# Patient Record
Sex: Female | Born: 1960 | Race: White | Hispanic: No | Marital: Single
Health system: Southern US, Community
[De-identification: ages and names within clinical notes are randomized; demographics above are authoritative.]

---

## 2020-10-24 ENCOUNTER — Observation Stay (HOSPITAL_COMMUNITY): Payer: Self-pay

## 2020-10-24 ENCOUNTER — Emergency Department (HOSPITAL_COMMUNITY): Payer: Self-pay

## 2020-10-24 ENCOUNTER — Encounter (HOSPITAL_COMMUNITY): Payer: Self-pay | Admitting: Neurology

## 2020-10-24 ENCOUNTER — Observation Stay (HOSPITAL_COMMUNITY)
Admission: EM | Admit: 2020-10-24 | Discharge: 2020-10-25 | Disposition: A | Discharge status: Home / Self Care | Payer: Self-pay | Attending: Student in an Organized Health Care Education/Training Program | Admitting: Student in an Organized Health Care Education/Training Program

## 2020-10-24 DIAGNOSIS — Z20822 Contact with and (suspected) exposure to covid-19: Secondary | ICD-10-CM | POA: Insufficient documentation

## 2020-10-24 DIAGNOSIS — R479 Unspecified speech disturbances: Principal | ICD-10-CM | POA: Insufficient documentation

## 2020-10-24 DIAGNOSIS — Z79899 Other long term (current) drug therapy: Secondary | ICD-10-CM | POA: Insufficient documentation

## 2020-10-24 DIAGNOSIS — E669 Obesity, unspecified: Secondary | ICD-10-CM | POA: Diagnosis present

## 2020-10-24 DIAGNOSIS — G459 Transient cerebral ischemic attack, unspecified: Secondary | ICD-10-CM | POA: Diagnosis present

## 2020-10-24 DIAGNOSIS — I10 Essential (primary) hypertension: Secondary | ICD-10-CM | POA: Diagnosis present

## 2020-10-24 LAB — COMPREHENSIVE METABOLIC PANEL
ALT: 36 U/L (ref 0–44)
AST: 27 U/L (ref 15–41)
Albumin: 3.9 g/dL (ref 3.5–5.0)
Alkaline Phosphatase: 80 U/L (ref 38–126)
Anion gap: 11 (ref 5–15)
BUN: 15 mg/dL (ref 8–23)
CO2: 26 mmol/L (ref 22–32)
Calcium: 9.8 mg/dL (ref 8.9–10.3)
Chloride: 101 mmol/L (ref 98–111)
Creatinine, Ser: 0.71 mg/dL (ref 0.44–1.00)
GFR, Estimated: >60 mL/min (ref >60)
Glucose, Bld: 103 mg/dL — ABNORMAL HIGH (ref 70–99)
Potassium: 3.9 mmol/L (ref 3.5–5.1)
Sodium: 138 mmol/L (ref 135–145)
Total Bilirubin: 0.6 mg/dL (ref 0.3–1.2)
Total Protein: 6.8 g/dL (ref 6.5–8.1)

## 2020-10-24 LAB — RAPID URINE DRUG SCREEN, HOSP PERFORMED
Amphetamines: NOT DETECTED
Barbiturates: NOT DETECTED
Benzodiazepines: POSITIVE — AB
Cocaine: NOT DETECTED
Opiates: NOT DETECTED
Tetrahydrocannabinol: NOT DETECTED

## 2020-10-24 LAB — CBC
HCT: 44.8 % (ref 36.0–46.0)
Hemoglobin: 14.4 g/dL (ref 12.0–15.0)
MCH: 29.4 pg (ref 26.0–34.0)
MCHC: 32.1 g/dL (ref 30.0–36.0)
MCV: 91.4 fL (ref 80.0–100.0)
Platelets: 260 10*3/uL (ref 150–400)
RBC: 4.9 MIL/uL (ref 3.87–5.11)
RDW: 12.6 % (ref 11.5–15.5)
WBC: 8.9 10*3/uL (ref 4.0–10.5)
nRBC: 0 % (ref 0.0–0.2)

## 2020-10-24 LAB — DIFFERENTIAL
Abs Immature Granulocytes: 0.03 10*3/uL (ref 0.00–0.07)
Basophils Absolute: 0.1 10*3/uL (ref 0.0–0.1)
Basophils Relative: 1 %
Eosinophils Absolute: 0.4 10*3/uL (ref 0.0–0.5)
Eosinophils Relative: 4 %
Immature Granulocytes: 0 %
Lymphocytes Relative: 16 %
Lymphs Abs: 1.5 10*3/uL (ref 0.7–4.0)
Monocytes Absolute: 0.8 10*3/uL (ref 0.1–1.0)
Monocytes Relative: 8 %
Neutro Abs: 6.3 10*3/uL (ref 1.7–7.7)
Neutrophils Relative %: 71 %

## 2020-10-24 LAB — URINALYSIS, ROUTINE W REFLEX MICROSCOPIC
Bilirubin Urine: NEGATIVE
Glucose, UA: NEGATIVE mg/dL
Hgb urine dipstick: NEGATIVE
Ketones, ur: NEGATIVE mg/dL
Leukocytes,Ua: NEGATIVE
Nitrite: NEGATIVE
Protein, ur: NEGATIVE mg/dL
Specific Gravity, Urine: 1.011 (ref 1.005–1.030)
pH: 6 (ref 5.0–8.0)

## 2020-10-24 LAB — PROTIME-INR
INR: 1 (ref 0.8–1.2)
Prothrombin Time: 13 seconds (ref 11.4–15.2)

## 2020-10-24 LAB — CBG MONITORING, ED: Glucose-Capillary: 97 mg/dL (ref 70–99)

## 2020-10-24 LAB — I-STAT CHEM 8, ED
BUN: 17 mg/dL (ref 8–23)
Calcium, Ion: 1.2 mmol/L (ref 1.15–1.40)
Chloride: 103 mmol/L (ref 98–111)
Creatinine, Ser: 0.7 mg/dL (ref 0.44–1.00)
Glucose, Bld: 99 mg/dL (ref 70–99)
HCT: 42 % (ref 36.0–46.0)
Hemoglobin: 14.3 g/dL (ref 12.0–15.0)
Potassium: 4 mmol/L (ref 3.5–5.1)
Sodium: 139 mmol/L (ref 135–145)
TCO2: 28 mmol/L (ref 22–32)

## 2020-10-24 LAB — APTT: aPTT: 26 seconds (ref 24–36)

## 2020-10-24 LAB — RESP PANEL BY RT PCR (RSV, FLU A&B, COVID)
Influenza A by PCR: NEGATIVE
Influenza B by PCR: NEGATIVE
Respiratory Syncytial Virus by PCR: NEGATIVE
SARS Coronavirus 2 by RT PCR: NEGATIVE

## 2020-10-24 LAB — ETHANOL: Alcohol, Ethyl (B): <10 mg/dL (ref <10)

## 2020-10-24 MED ORDER — GADOBUTROL 1 MMOL/ML IV SOLN
10.0000 mL | Freq: Once | INTRAVENOUS | Status: AC | PRN
  Administered 2020-10-24: 10 mL via INTRAVENOUS

## 2020-10-24 MED ORDER — ASPIRIN 325 MG PO TABS: 325.0000 mg | ORAL_TABLET | Freq: Once | ORAL | Status: DC

## 2020-10-24 MED ORDER — ACETAMINOPHEN 160 MG/5ML PO SOLN: 650.0000 mg | ORAL | Status: DC | PRN

## 2020-10-24 MED ORDER — STROKE: EARLY STAGES OF RECOVERY BOOK
Freq: Once | Status: AC
  Filled 2020-10-24: qty 1

## 2020-10-24 MED ORDER — ACETAMINOPHEN 325 MG PO TABS: 650.0000 mg | ORAL_TABLET | ORAL | Status: DC | PRN

## 2020-10-24 MED ORDER — SENNOSIDES-DOCUSATE SODIUM 8.6-50 MG PO TABS: 1.0000 | ORAL_TABLET | Freq: Every evening | ORAL | Status: DC | PRN

## 2020-10-24 MED ORDER — ATORVASTATIN CALCIUM 80 MG PO TABS
80.0000 mg | ORAL_TABLET | Freq: Every day | ORAL | Status: DC
  Administered 2020-10-25: 80 mg via ORAL
  Filled 2020-10-24: qty 1

## 2020-10-24 MED ORDER — ACETAMINOPHEN 650 MG RE SUPP: 650.0000 mg | RECTAL | Status: DC | PRN

## 2020-10-24 MED ORDER — ASPIRIN 81 MG PO CHEW
81.0000 mg | CHEWABLE_TABLET | Freq: Every day | ORAL | Status: DC
  Administered 2020-10-25: 81 mg via ORAL
  Filled 2020-10-24: qty 1
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
## 2020-10-24 NOTE — Procedures (Signed)
Patient Name: Zoe Bryan  MRN: [id number]  Epilepsy Attending: Charlsie Quest  Referring Physician/Provider: Felicie Morn, PA Date: 10/24/2020 Duration: 24.58 mins  Patient history: 59 yo F with transient speech disturbance. EEG to evaluate for seizure  Level of alertness: Awake  AEDs during EEG study: Versed  Technical aspects: This EEG study was done with scalp electrodes positioned according to the 10-20 International system of electrode placement. Electrical activity was acquired at a sampling rate of 500Hz  and reviewed with a high frequency filter of 70Hz  and a low frequency filter of 1Hz . EEG data were recorded continuously and digitally stored.   Description: The posterior dominant rhythm consists of 9-10 Hz activity of moderate voltage (25-35 uV) seen predominantly in posterior head regions, symmetric and reactive to eye opening and eye closing. Physiologic photic driving was seen during photic stimulation.  Hyperventilation was not performed.     IMPRESSION: This study is within normal limits. No seizures or epileptiform discharges were seen throughout the recording.  Cortlan Dolin 

## 2020-10-24 NOTE — Code Documentation (Signed)
Stroke Response Nurse Documentation Code Documentation  Zoe Bryan is a 59 y.o. female arriving to [facility] ED via [facility] on 10/24/2020 with unkown PMH. Code stroke was activated by EMS. Patient from home where she was with her boyfriend, patient left room to make food and when she came back was "not behaving like herself". Per boyfriend patient walked to a nearby window and began counting down backwards from 10. On No antithrombotic. Stroke team at the bedside on patient arrival. Labs drawn and patient cleared for CT by Dr. Rush Landmark. Patient to CT with team. NIHSS 0, symptoms resolved en route to hospital, see documentation for details and code stroke times. The following imaging was completed:  CT head, MRI. Patient is not a candidate for tPA due to resolution of symptoms. Care/Plan: q2h vs/neuro checks for next 12h, see orders. Bedside handoff with ED RN Annice Pih.    Zoe Bryan  Stroke Response RN

## 2020-10-24 NOTE — ED Notes (Signed)
BP cuff replaced on patients arm.

## 2020-10-24 NOTE — ED Provider Notes (Signed)
[facility] EMERGENCY DEPARTMENT Provider Note   CSN: [id number] Arrival date & time: 10/24/20  1343     History No chief complaint on file.   Zoe Bryan is a 59 y.o. female.  The history is provided by the patient and medical records. No language interpreter was used.  Neurologic Problem This is a new problem. The current episode started 1 to 2 hours ago. The problem occurs constantly. The problem has been resolved. Pertinent negatives include no chest pain, no abdominal pain, no headaches and no shortness of breath. Nothing aggravates the symptoms. Nothing relieves the symptoms. She has tried nothing for the symptoms. The treatment provided no relief.       No past medical history on file.  There are no problems to display for this patient.     OB History   No obstetric history on file.     No family history on file.  Social History   Tobacco Use  . Smoking status: Not on file  Substance Use Topics  . Alcohol use: Not on file  . Drug use: Not on file    Home Medications Prior to Admission medications   Not on File    Allergies    Patient has no allergy information on record.  Review of Systems   Review of Systems  Constitutional: Negative for chills, diaphoresis, fatigue and fever.  HENT: Negative for congestion.   Eyes: Negative for photophobia and visual disturbance.  Respiratory: Negative for cough, chest tightness, shortness of breath and wheezing.   Cardiovascular: Negative for chest pain.  Gastrointestinal: Negative for abdominal pain, constipation, diarrhea, nausea and vomiting.  Genitourinary: Negative for dysuria, flank pain and frequency.  Musculoskeletal: Negative for back pain, neck pain and neck stiffness.  Skin: Negative for rash and wound.  Neurological: Positive for speech difficulty. Negative for dizziness, syncope, weakness, light-headedness, numbness and headaches.  Psychiatric/Behavioral: Negative for  agitation, behavioral problems and confusion.  All other systems reviewed and are negative.   Physical Exam Updated Vital Signs BP 132/78   Pulse 93   Temp 97.9 F (36.6 C) (Oral)   Resp 19   SpO2 95%   Physical Exam Vitals and nursing note reviewed.  Constitutional:      General: She is not in acute distress.    Appearance: She is well-developed. She is not ill-appearing, toxic-appearing or diaphoretic.  HENT:     Head: Normocephalic and atraumatic.     Nose: No congestion or rhinorrhea.     Mouth/Throat:     Mouth: Mucous membranes are moist.     Pharynx: No oropharyngeal exudate or posterior oropharyngeal erythema.  Eyes:     Extraocular Movements: Extraocular movements intact.     Conjunctiva/sclera: Conjunctivae normal.     Pupils: Pupils are equal, round, and reactive to light.  Cardiovascular:     Rate and Rhythm: Normal rate and regular rhythm.     Pulses: Normal pulses.     Heart sounds: No murmur heard.   Pulmonary:     Effort: Pulmonary effort is normal. No respiratory distress.     Breath sounds: Normal breath sounds. No wheezing, rhonchi or rales.  Chest:     Chest wall: No tenderness.  Abdominal:     General: Abdomen is flat.     Palpations: Abdomen is soft.     Tenderness: There is no abdominal tenderness. There is no right CVA tenderness, left CVA tenderness, guarding or rebound.  Musculoskeletal:  General: No tenderness, deformity or signs of injury.     Cervical back: Neck supple. No tenderness.     Right lower leg: No edema.     Left lower leg: No edema.  Skin:    General: Skin is warm and dry.     Capillary Refill: Capillary refill takes less than 2 seconds.     Findings: No erythema.  Neurological:     General: No focal deficit present.     Mental Status: She is alert and oriented to person, place, and time.     Sensory: No sensory deficit.     Motor: No weakness.     Coordination: Coordination normal.  Psychiatric:        Mood and  Affect: Mood normal.     ED Results / Procedures / Treatments   Labs (all labs ordered are listed, but only abnormal results are displayed) Labs Reviewed  COMPREHENSIVE METABOLIC PANEL - Abnormal; Notable for the following components:      Result Value   Glucose, Bld 103 (*)    All other components within normal limits  ETHANOL  PROTIME-INR  APTT  CBC  DIFFERENTIAL  RAPID URINE DRUG SCREEN, HOSP PERFORMED  URINALYSIS, ROUTINE W REFLEX MICROSCOPIC  I-STAT CHEM 8, ED  CBG MONITORING, ED    EKG EKG Interpretation  Date/Time:  Thursday October 24 2020 15:11:31 EDT Ventricular Rate:  90 PR Interval:    QRS Duration: 101 QT Interval:  348 QTC Calculation: 426 R Axis:   63 Text Interpretation: Sinus rhythm Low voltage, precordial leads no prior ECG for comparison. No STEMI Confirmed by Theda Belfast ([id number]) on 10/24/2020 3:50:49 PM   Radiology MR ANGIO HEAD WO CONTRAST  Result Date: 10/24/2020 CLINICAL DATA:  Seizure, abnormal neuro exam; Neuro deficit, acute, stroke suspected EXAM: MRI HEAD WITHOUT AND WITH CONTRAST MRA HEAD WITHOUT CONTRAST MRA NECK WITHOUT AND WITH CONTRAST TECHNIQUE: Multiplanar, multiecho pulse sequences of the brain and surrounding structures were obtained without and with intravenous contrast. Angiographic images of the Circle of Willis were obtained using MRA technique without intravenous contrast. Angiographic images of the neck were obtained using MRA technique without and with intravenous contrast. Carotid stenosis measurements (when applicable) are obtained utilizing NASCET criteria, using the distal internal carotid diameter as the denominator. CONTRAST:  32mL GADAVIST GADOBUTROL 1 MMOL/ML IV SOLN COMPARISON:  10/24/2020 head CT. FINDINGS: MRI HEAD FINDINGS Brain: No diffusion-weighted signal abnormality. No intracranial hemorrhage. No midline shift, ventriculomegaly or extra-axial fluid collection. No mass lesion. No abnormal enhancement. Cerebral  volume is within normal limits. Minimal chronic microvascular ischemic changes. Vascular: Small right frontal developmental venous anomaly. Please see MRA for additional details. Skull and upper cervical spine: Normal marrow signal. Sinuses/Orbits: Normal orbits. Left sphenoid sinus mucous retention cyst. No mastoid effusion. Other: None. MRA HEAD FINDINGS Anterior circulation: Patent ICAs, anterior and middle cerebral arteries. No significant stenosis, proximal occlusion, aneurysm, or vascular malformation. Bilateral PCOM hypoplasia. Posterior circulation: Patent PICA. The V4 segments, basilar, superior cerebellar and posterior cerebral arteries are patent. Mild narrowing of the left superior cerebellar artery. No high-grade stenosis, large vessel occlusion, aneurysm, or vascular malformation. Venous sinuses: No evidence of thrombosis. Anatomic variants: Please see above. MRA NECK FINDINGS There is no high-grade narrowing or focal aneurysm involving the bilateral carotid arteries. No evidence of dissection. The bilateral vertebral arteries are patent and demonstrate antegrade flow. Codominant vertebral arteries. No evidence of high-grade narrowing or focal aneurysm. IMPRESSION: MRI head: No acute intracranial process. Minimal chronic microvascular  ischemic changes. MRA head and neck: No large vessel occlusion, high-grade narrowing, dissection or aneurysm. Mild left superior cerebellar artery narrowing. Electronically Signed   By: Stana Bunting M.D.   On: 10/24/2020 15:38   MR Angiogram Neck W or Wo Contrast  Result Date: 10/24/2020 CLINICAL DATA:  Seizure, abnormal neuro exam; Neuro deficit, acute, stroke suspected EXAM: MRI HEAD WITHOUT AND WITH CONTRAST MRA HEAD WITHOUT CONTRAST MRA NECK WITHOUT AND WITH CONTRAST TECHNIQUE: Multiplanar, multiecho pulse sequences of the brain and surrounding structures were obtained without and with intravenous contrast. Angiographic images of the Circle of Willis were  obtained using MRA technique without intravenous contrast. Angiographic images of the neck were obtained using MRA technique without and with intravenous contrast. Carotid stenosis measurements (when applicable) are obtained utilizing NASCET criteria, using the distal internal carotid diameter as the denominator. CONTRAST:  10mL GADAVIST GADOBUTROL 1 MMOL/ML IV SOLN COMPARISON:  10/24/2020 head CT. FINDINGS: MRI HEAD FINDINGS Brain: No diffusion-weighted signal abnormality. No intracranial hemorrhage. No midline shift, ventriculomegaly or extra-axial fluid collection. No mass lesion. No abnormal enhancement. Cerebral volume is within normal limits. Minimal chronic microvascular ischemic changes. Vascular: Small right frontal developmental venous anomaly. Please see MRA for additional details. Skull and upper cervical spine: Normal marrow signal. Sinuses/Orbits: Normal orbits. Left sphenoid sinus mucous retention cyst. No mastoid effusion. Other: None. MRA HEAD FINDINGS Anterior circulation: Patent ICAs, anterior and middle cerebral arteries. No significant stenosis, proximal occlusion, aneurysm, or vascular malformation. Bilateral PCOM hypoplasia. Posterior circulation: Patent PICA. The V4 segments, basilar, superior cerebellar and posterior cerebral arteries are patent. Mild narrowing of the left superior cerebellar artery. No high-grade stenosis, large vessel occlusion, aneurysm, or vascular malformation. Venous sinuses: No evidence of thrombosis. Anatomic variants: Please see above. MRA NECK FINDINGS There is no high-grade narrowing or focal aneurysm involving the bilateral carotid arteries. No evidence of dissection. The bilateral vertebral arteries are patent and demonstrate antegrade flow. Codominant vertebral arteries. No evidence of high-grade narrowing or focal aneurysm. IMPRESSION: MRI head: No acute intracranial process. Minimal chronic microvascular ischemic changes. MRA head and neck: No large vessel  occlusion, high-grade narrowing, dissection or aneurysm. Mild left superior cerebellar artery narrowing. Electronically Signed   By: Stana Bunting M.D.   On: 10/24/2020 15:38   MR Brain W and Wo Contrast  Result Date: 10/24/2020 CLINICAL DATA:  Seizure, abnormal neuro exam; Neuro deficit, acute, stroke suspected EXAM: MRI HEAD WITHOUT AND WITH CONTRAST MRA HEAD WITHOUT CONTRAST MRA NECK WITHOUT AND WITH CONTRAST TECHNIQUE: Multiplanar, multiecho pulse sequences of the brain and surrounding structures were obtained without and with intravenous contrast. Angiographic images of the Circle of Willis were obtained using MRA technique without intravenous contrast. Angiographic images of the neck were obtained using MRA technique without and with intravenous contrast. Carotid stenosis measurements (when applicable) are obtained utilizing NASCET criteria, using the distal internal carotid diameter as the denominator. CONTRAST:  10mL GADAVIST GADOBUTROL 1 MMOL/ML IV SOLN COMPARISON:  10/24/2020 head CT. FINDINGS: MRI HEAD FINDINGS Brain: No diffusion-weighted signal abnormality. No intracranial hemorrhage. No midline shift, ventriculomegaly or extra-axial fluid collection. No mass lesion. No abnormal enhancement. Cerebral volume is within normal limits. Minimal chronic microvascular ischemic changes. Vascular: Small right frontal developmental venous anomaly. Please see MRA for additional details. Skull and upper cervical spine: Normal marrow signal. Sinuses/Orbits: Normal orbits. Left sphenoid sinus mucous retention cyst. No mastoid effusion. Other: None. MRA HEAD FINDINGS Anterior circulation: Patent ICAs, anterior and middle cerebral arteries. No significant stenosis, proximal occlusion, aneurysm, or  vascular malformation. Bilateral PCOM hypoplasia. Posterior circulation: Patent PICA. The V4 segments, basilar, superior cerebellar and posterior cerebral arteries are patent. Mild narrowing of the left superior  cerebellar artery. No high-grade stenosis, large vessel occlusion, aneurysm, or vascular malformation. Venous sinuses: No evidence of thrombosis. Anatomic variants: Please see above. MRA NECK FINDINGS There is no high-grade narrowing or focal aneurysm involving the bilateral carotid arteries. No evidence of dissection. The bilateral vertebral arteries are patent and demonstrate antegrade flow. Codominant vertebral arteries. No evidence of high-grade narrowing or focal aneurysm. IMPRESSION: MRI head: No acute intracranial process. Minimal chronic microvascular ischemic changes. MRA head and neck: No large vessel occlusion, high-grade narrowing, dissection or aneurysm. Mild left superior cerebellar artery narrowing. Electronically Signed   By: Stana Buntinghikanele  Emekauwa M.D.   On: 10/24/2020 15:38   CT HEAD CODE STROKE WO CONTRAST  Result Date: 10/24/2020 CLINICAL DATA:  Code stroke. Neuro deficit, acute, stroke suspected. Additional provided: Altered mental status, speech difficulty. EXAM: CT HEAD WITHOUT CONTRAST TECHNIQUE: Contiguous axial images were obtained from the base of the skull through the vertex without intravenous contrast. COMPARISON:  No pertinent prior exams are available for comparison. FINDINGS: Brain: Cerebral volume is normal for age. There is no acute intracranial hemorrhage. No demarcated cortical infarct. No extra-axial fluid collection. No evidence of intracranial mass. No midline shift. Partially empty sella turcica. Vascular: No hyperdense vessel. Skull: Normal. Negative for fracture or focal lesion. Sinuses/Orbits: Visualized orbits show no acute finding. Small left sphenoid sinus mucous retention cyst. ASPECTS ([place]berta Stroke Program Early CT Score) - Ganglionic level infarction (caudate, lentiform nuclei, internal capsule, insula, M1-M3 cortex): 7 - Supraganglionic infarction (M4-M6 cortex): 3 Total score (0-10 with 10 being normal): 10 These results were called by telephone at the time of  interpretation on 10/24/2020 at 2:05 pm to provider Dr. Iver NestleBhagat, who verbally acknowledged these results. IMPRESSION: No evidence of acute intracranial abnormality.  ASPECTS is 10. Electronically Signed   By: Jackey LogeKyle  Golden DO   On: 10/24/2020 14:06    Procedures Procedures (including critical care time)  Medications Ordered in ED Medications  gadobutrol (GADAVIST) 1 MMOL/ML injection 10 mL (10 mLs Intravenous Contrast Given 10/24/20 1510)    ED Course  I have reviewed the triage vital signs and the nursing notes.  Pertinent labs & imaging results that were available during my care of the patient were reviewed by me and considered in my medical decision making (see chart for details).    MDM Rules/Calculators/A&P                          Zoe Gilfordatricia Grosser is a 59 y.o. female with no significant past medical history who presents as a code stroke for difficulty with speech and object identification.  Patient was last normal at 12:20 PM.  According to EMS, patient was found by family staring off into space and talking to herself.  She not answering questions appropriate.  She cannot identify objects with EMS.  Patient then got agitated and had to get Versed for agitation.  She was not having any seizure-like activity they could see.  Patient was back to baseline by the time she arrived to the emergency department and denies any complaints currently.  On initial exam at the bridge, airway was intact.  She was quickly taken to CT scanner.  On further exam, she had normal strength, sensation in extremities.  Facial exam showed no droop.  Clear speech.  Normal object identification  on my exam.  Pupils are symmetric reactive normal extraocular movements.  Lungs were clear and chest was nontender.  Abdomen was nontender.  Patient resting comfortably.  CT imaging did not show acute stroke.  Neurology recommended MRI.  They feel that even if MRI is normal, they are concerned she could have had either a TIA  versus a seizure.  They request admission for further work-up and management.  Medicine team called for admission and they will see and admit patient.  Final Clinical Impression(s) / ED Diagnoses Final diagnoses:  Transient speech disturbance     Clinical Impression: 1. Transient speech disturbance     Disposition: Admit  This note was prepared with assistance of Dragon voice recognition software. Occasional wrong-word or sound-a-like substitutions may have occurred due to the inherent limitations of voice recognition software.      Nardos Putnam, Canary Brim, MD 10/24/20 1555

## 2020-10-24 NOTE — ED Notes (Signed)
Patient ambulated to the bathroom with no difficulty.
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
## 2020-10-24 NOTE — Progress Notes (Signed)
EEG complete - results pending 

## 2020-10-24 NOTE — ED Triage Notes (Signed)
Pt arrived by EMS from home. Code Stroke Called.  Per medics at 1220 pt significant other noticed pt was staring into space and talking to herself.  Medics report pt not appropriately answering questions and becoming combative when  Trying to assess her. Medics gave 2mg  Versed for agitation   On arrival pt alert and oriented, no slurred speech. Answering questions approprietly

## 2020-10-25 ENCOUNTER — Other Ambulatory Visit: Payer: Self-pay

## 2020-10-25 ENCOUNTER — Observation Stay (HOSPITAL_BASED_OUTPATIENT_CLINIC_OR_DEPARTMENT_OTHER): Payer: Self-pay

## 2020-10-25 DIAGNOSIS — E785 Hyperlipidemia, unspecified: Secondary | ICD-10-CM

## 2020-10-25 DIAGNOSIS — I10 Essential (primary) hypertension: Secondary | ICD-10-CM

## 2020-10-25 DIAGNOSIS — G459 Transient cerebral ischemic attack, unspecified: Secondary | ICD-10-CM

## 2020-10-25 DIAGNOSIS — E669 Obesity, unspecified: Secondary | ICD-10-CM | POA: Diagnosis present

## 2020-10-25 LAB — ECHOCARDIOGRAM COMPLETE
Area-P 1/2: 3.91 cm2
S' Lateral: 2.5 cm

## 2020-10-25 LAB — HEMOGLOBIN A1C
Hgb A1c MFr Bld: 5.1 % (ref 4.8–5.6)
Mean Plasma Glucose: 99.67 mg/dL

## 2020-10-25 LAB — LIPID PANEL
Cholesterol: 148 mg/dL (ref 0–200)
HDL: 39 mg/dL — ABNORMAL LOW (ref >40)
LDL Cholesterol: 89 mg/dL (ref 0–99)
Total CHOL/HDL Ratio: 3.8 RATIO
Triglycerides: 102 mg/dL (ref <150)
VLDL: 20 mg/dL (ref 0–40)

## 2020-10-25 LAB — HIV ANTIBODY (ROUTINE TESTING W REFLEX): HIV Screen 4th Generation wRfx: NONREACTIVE

## 2020-10-25 MED ORDER — LISINOPRIL 20 MG PO TABS
20.0000 mg | ORAL_TABLET | Freq: Every day | ORAL | Status: DC
  Administered 2020-10-25: 20 mg via ORAL
  Filled 2020-10-25: qty 1

## 2020-10-25 MED ORDER — LISINOPRIL-HYDROCHLOROTHIAZIDE 20-25 MG PO TABS: 1.0000 | ORAL_TABLET | Freq: Every day | ORAL | Status: DC

## 2020-10-25 MED ORDER — ASPIRIN EC 81 MG PO TBEC: 81.0000 mg | DELAYED_RELEASE_TABLET | Freq: Every day | ORAL | 0 refills | Status: AC

## 2020-10-25 MED ORDER — ATORVASTATIN CALCIUM 80 MG PO TABS: 80.0000 mg | ORAL_TABLET | Freq: Every day | ORAL | 0 refills | Status: AC

## 2020-10-25 MED ORDER — HYDROCHLOROTHIAZIDE 25 MG PO TABS
25.0000 mg | ORAL_TABLET | Freq: Every day | ORAL | Status: DC
  Filled 2020-10-25: qty 1
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
## 2020-10-25 NOTE — Progress Notes (Signed)
Spoke with Zoe Bryan at bedside about her echo result. Discussed importance of f/u with outpatient PCP and neurology. Zoe Bryan expressed understanding.

## 2020-10-25 NOTE — Plan of Care (Signed)
  Problem: Education: Goal: Knowledge of disease or condition will improve Outcome: Progressing   

## 2020-10-25 NOTE — Progress Notes (Signed)
  Echocardiogram 2D Echocardiogram has been performed.  Tye Savoy 10/25/2020, 1:26 PM
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
## 2020-10-25 NOTE — Discharge Instructions (Signed)
Transient Ischemic Attack  A transient ischemic attack (TIA) is a "warning stroke" that causes stroke-like symptoms that go away quickly. A TIA does not cause lasting damage to the brain. But having a TIA is a sign that you may be at risk for a stroke. Lifestyle changes and medical treatments can help prevent a stroke. It is important to know the symptoms of a TIA and what to do. Get help right away, even if your symptoms go away. The symptoms of a TIA are the same as those of a stroke. They can happen fast, and they usually go away within minutes or hours. They can include:  Weakness or loss of feeling in your face, arm, or leg. This often happens on one side of your body.  Trouble walking.  Trouble moving your arms or legs.  Trouble talking or understanding what people are saying.  Trouble seeing.  Seeing two of one object (double vision).  Feeling dizzy.  Feeling confused.  Loss of balance or coordination.  Feeling sick to your stomach (nauseous) and throwing up (vomiting).  A very bad headache for no reason. What increases the risk? Certain things may make you more likely to have a TIA. Some of these are things that you can change, such as:  Being very overweight (obese).  Using products that contain nicotine or tobacco, such as cigarettes and e-cigarettes.  Taking birth control pills.  Not being active.  Drinking too much alcohol.  Using drugs. Other risk factors include:  Having an irregular heartbeat (atrial fibrillation).  Being African American or Hispanic.  Having had blood clots, stroke, TIA, or heart attack in the past.  Being a woman with a history of high blood pressure in pregnancy (preeclampsia).  Being over the age of 60.  Being female.  Having family history of stroke.  Having the following diseases or conditions: ? High blood pressure. ? High cholesterol. ? Diabetes. ? Heart disease. ? Sickle cell disease. ? Sleep apnea. ? Migraine  headache. ? Long-term (chronic) diseases that cause soreness and swelling (inflammation). ? Disorders that affect how your blood clots. Follow these instructions at home: Medicines   Take over-the-counter and prescription medicines only as told by your doctor.  If you were told to take aspirin or another medicine to thin your blood, take it exactly as told by your doctor. ? Taking too much of the medicine can cause bleeding. ? Taking too little of the medicine may not work to treat the problem. Eating and drinking   Eat 5 or more servings of fruits and vegetables each day.  Follow instructions from your doctor about your diet. You may need to follow a certain diet to help lower your risk of having a stroke. You may need to: ? Eat a diet that is low in fat and salt. ? Eat foods that contain a lot of fiber. ? Limit the amount of carbohydrates and sugar in your diet.  Limit alcohol intake to 1 drink a day for nonpregnant women and 2 drinks a day for men. One drink equals 12 oz of beer, 5 oz of wine, or 1 oz of hard liquor. General instructions  Keep a healthy weight.  Stay active. Try to get at least 30 minutes of activity on all or most days.  Find out if you have a condition called sleep apnea. Get treatment if needed.  Do not use any products that contain nicotine or tobacco, such as cigarettes and e-cigarettes. If you need help quitting,   ask your doctor.  Do not abuse drugs.  Keep all follow-up visits as told by your doctor. This is important. Get help right away if:  You have any signs of stroke. "BE FAST" is an easy way to remember the main warning signs: ? B - Balance. Signs are dizziness, sudden trouble walking, or loss of balance. ? E - Eyes. Signs are trouble seeing or a sudden change in how you see. ? F - Face. Signs are sudden weakness or loss of feeling of the face, or the face or eyelid drooping on one side. ? A - Arms. Signs are weakness or loss of feeling in an  arm. This happens suddenly and usually on one side of the body. ? S - Speech. Signs are sudden trouble speaking, slurred speech, or trouble understanding what people say. ? T - Time. Time to call emergency services. Write down what time symptoms started.  You have other signs of stroke, such as: ? A sudden, very bad headache with no known cause. ? Feeling sick to your stomach (nausea). ? Throwing up (vomiting). ? Jerky movements that you cannot control (seizure). These symptoms may be an emergency. Do not wait to see if the symptoms will go away. Get medical help right away. Call your local emergency services (911 in the [place] Do not drive yourself to the hospital. Summary  A transient ischemic attack (TIA) is a "warning stroke" that causes stroke-like symptoms that go away quickly.  A TIA is a medical emergency. Get help right away, even if your symptoms go away.  A TIA does not cause lasting damage to the brain.  Having a TIA is a sign that you may be at risk for a stroke. Lifestyle changes and medical treatments can help prevent a stroke. This information is not intended to replace advice given to you by your health care provider. Make sure you discuss any questions you have with your health care provider. Document Revised: 09/02/2018 Document Reviewed: 03/10/2017 Elsevier Patient Education  2020 [employer]  

## 2020-10-25 NOTE — Progress Notes (Signed)
Occupational Therapy screen. Patient Details Name: Zoe Bryan MRN: [id number] DOB: 1961/10/04 Today's Date: 10/25/2020    History of present illness 59yo female presenting with expressive aphasia and "brain fog". Received Versed from EMT and symptoms resolved in less than 1 hour. MRI/MRA/CTH negative, EEG negative for seizure activity. No tpa given. Admitted for w/u of TIA vs seizure activity. PMH HTN, OA, seizure   OT comments  OT screen performed. Pt. Does not have any focal deficits. Pt. Does not have any mobility impairments. OT screen only.   Follow Up Recommendations       Equipment Recommendations       Recommendations for Other Services      Precautions / Restrictions Precautions Precautions: None Restrictions Weight Bearing Restrictions: No       Mobility Bed Mobility Overal bed mobility: Modified Independent             General bed mobility comments: HOB elevated for supine <-> sit, use of bed rails  Transfers Overall transfer level: Independent Equipment used: None             General transfer comment: distant S for safety, no physical assist given    Balance Overall balance assessment: Independent                                         ADL either performed or assessed with clinical judgement   ADL                                               Vision       Perception     Praxis      Cognition Arousal/Alertness: Awake/alert Behavior During Therapy: WFL for tasks assessed/performed Overall Cognitive Status: Within Functional Limits for tasks assessed                                          Exercises     Shoulder Instructions       General Comments HR to 123BPM with gait in hallway    Pertinent Vitals/ Pain       Pain Assessment: 0-10 Pain Score: 3  Pain Location: knees with mobility Pain Descriptors / Indicators: Tightness;Sore Pain Intervention(s):  Repositioned;Limited activity within patient's tolerance;Monitored during session  Home Living Family/patient expects to be discharged to:: Private residence Living Arrangements: Children Available Help at Discharge: Family;Available PRN/intermittently Type of Home: House Home Access: Ramped entrance     Home Layout: Two level Alternate Level Stairs-Number of Steps: 12 with R ascending rail Alternate Level Stairs-Rails: Right Bathroom Shower/Tub: Chief Strategy Officer: Standard     Home Equipment: None          Prior Functioning/Environment Level of Independence: Independent            Frequency           Progress Toward Goals  OT Goals(current goals can now be found in the care plan section)     Acute Rehab OT Goals Patient Stated Goal: go home today  Plan      Co-evaluation  AM-PAC OT "6 Clicks" Daily Activity     Outcome Measure                    End of Session        Activity Tolerance     Patient Left     Nurse Communication          Time:  -     Charges:    Zoe Bryan OT/L    Esraa Seres 10/25/2020, 10:45 AM
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
## 2020-10-25 NOTE — Evaluation (Signed)
Physical Therapy Evaluation Patient Details Name: Zoe Bryan MRN: [id number] DOB: Apr 20, 1961 Today's Date: 10/25/2020   History of Present Illness  59yo female presenting with expressive aphasia and "brain fog". Received Versed from EMT and symptoms resolved in less than 1 hour. MRI/MRA/CTH negative, EEG negative for seizure activity. No tpa given. Admitted for w/u of TIA vs seizure activity. PMH HTN, OA, seizure  Clinical Impression   Patient received sitting at EOB, surprised to see PT and looking forward to going home. Able to mobilize on a Mod(I) to independent basis with no device; does have mild gait impairment likely related to chronic knee pain, but she reports this is her baseline and has no concerns about mobility. HR to 123BPM with activity, returned to baseline with seated rest. Noted edema and mild inflammation B patellar tendons as well as hypomobile patellas and educated on cross-friction massage and self-patellar mobilizations to assist in reducing knee pain. Left sitting at EOB with all needs met. She is at her functional baseline with no need for skilled PT services- signing off, thank you for the opportunity to participate in her care!     Follow Up Recommendations No PT follow up    Equipment Recommendations  None recommended by PT    Recommendations for Other Services       Precautions / Restrictions Precautions Precautions: None Restrictions Weight Bearing Restrictions: No      Mobility  Bed Mobility Overal bed mobility: Modified Independent             General bed mobility comments: HOB elevated for supine <-> sit, use of bed rails    Transfers Overall transfer level: Independent Equipment used: None             General transfer comment: distant S for safety, no physical assist given  Ambulation/Gait Ambulation/Gait assistance: Independent Gait Distance (Feet): 200 Feet Assistive device: None Gait Pattern/deviations: Wide base of  support;WFL(Within Functional Limits);Step-through pattern;Trendelenburg Gait velocity: slightly decreased   General Gait Details: slow but steady with no device, gait pattern midly antalgic and consistent with prior reports of knee pain; 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    Modified Rankin (Stroke Patients Only)       Balance Overall balance assessment: Independent                                           Pertinent Vitals/Pain Pain Assessment: 0-10 Pain Score: 3  Pain Location: knees with mobility Pain Descriptors / Indicators: Tightness;Sore Pain Intervention(s): Repositioned;Limited activity within patient's tolerance;Monitored during session    [place] expects to be discharged to:: Private residence Living Arrangements: Children Available Help at Discharge: Family;Available PRN/intermittently Type of Home: House Home Access: Ramped entrance     Home Layout: Two level Home Equipment: None      Prior Function Level of Independence: Independent               Hand Dominance        Extremity/Trunk Assessment   Upper Extremity Assessment Upper Extremity Assessment: Overall WFL for tasks assessed    Lower Extremity Assessment Lower Extremity Assessment: Overall WFL for tasks assessed    Cervical / Trunk Assessment Cervical / Trunk Assessment: Normal  Communication   Communication: No difficulties  Cognition Arousal/Alertness: Awake/alert Behavior During  Therapy: WFL for tasks assessed/performed Overall Cognitive Status: Within Functional Limits for tasks assessed                                        General Comments General comments (skin integrity, edema, etc.): HR to 123BPM with gait in hallway    Exercises     Assessment/Plan    PT Assessment Patent does not need any further PT services  PT Problem List Decreased strength;Obesity;Decreased  mobility       PT Treatment Interventions Gait training;Therapeutic activities;Therapeutic exercise;Patient/family education    PT Goals (Current goals can be found in the Care Plan section)  Acute Rehab PT Goals Patient Stated Goal: go home today PT Goal Formulation: With patient Time For Goal Achievement: 11/08/20 Potential to Achieve Goals: Good    Frequency Other (Comment) (Eval only)   Barriers to discharge        Co-evaluation               AM-PAC PT "6 Clicks" Mobility  Outcome Measure Help needed turning from your back to your side while in a flat bed without using bedrails?: None Help needed moving from lying on your back to sitting on the side of a flat bed without using bedrails?: None Help needed moving to and from a bed to a chair (including a wheelchair)?: None Help needed standing up from a chair using your arms (e.g., wheelchair or bedside chair)?: None Help needed to walk in hospital room?: None Help needed climbing 3-5 steps with a railing? : A Little 6 Click Score: 23    End of Session   Activity Tolerance: Patient tolerated treatment well Patient left: in bed;with call bell/phone within reach (sitting at EOB per her request) Nurse Communication: Mobility status PT Visit Diagnosis: Muscle weakness (generalized) (M62.81)    Time: 6010-9323 PT Time Calculation (min) (ACUTE ONLY): 15 min   Charges:   PT Evaluation $PT Eval Low Complexity: 1 Low          Windell Norfolk, DPT, PN1   Supplemental Physical Therapist [place]    Pager [phone] Acute Rehab Office [phone]

## 2020-10-25 NOTE — Progress Notes (Signed)
Nsg Discharge Note  Admit Date:  10/24/2020 Discharge date: 10/25/2020   Zoe Bryan to be D/C'd Home per MD order.  AVS completed.  Copy for chart, and copy for patient signed, and dated. Patient/caregiver able to verbalize understanding.  Discharge Medication: Allergies as of 10/25/2020   No Known Allergies     Medication List    TAKE these medications   aspirin EC 81 MG tablet Take 1 tablet (81 mg total) by mouth daily. Swallow whole.   atorvastatin 80 MG tablet Commonly known as: Lipitor Take 1 tablet (80 mg total) by mouth daily.   FLEX-A-MIN JOINT FLEX PO Take 1 tablet by mouth in the morning and at bedtime.   ibuprofen 200 MG tablet Commonly known as: ADVIL Take 200 mg by mouth in the morning and at bedtime.   lisinopril-hydrochlorothiazide 20-25 MG tablet Commonly known as: ZESTORETIC Take 1 tablet by mouth daily.       Discharge Assessment: Vitals:   10/25/20 0813 10/25/20 1518  BP: (!) 143/67 (!) 137/93  Pulse: 92   Resp: 19   Temp: (!) 97.3 F (36.3 C)   SpO2: 94%    Skin clean, dry and intact without evidence of skin break down, no evidence of skin tears noted. IV catheter discontinued intact. Site without signs and symptoms of complications - no redness or edema noted at insertion site, patient denies c/o pain - only slight tenderness at site.  Dressing with slight pressure applied.  D/c Instructions-Education: Discharge instructions given to patient/family with verbalized understanding. D/c education completed with patient/family including follow up instructions, medication list, d/c activities limitations if indicated, with other d/c instructions as indicated by MD - patient able to verbalize understanding, all questions fully answered. Patient instructed to return to ED, call 911, or call MD for any changes in condition.  Patient escorted via WC, and D/C home via private auto.  Boykin Nearing, RN 10/25/2020 5:34 PM
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
## 2020-10-25 NOTE — Progress Notes (Signed)
Patient is seen in person at 3:15 pm by the resident provider and she is found to be focused on her discharge. She states she would not like to wait anymore for Echocardiogram results and she can be informed about the results via telephone. She adds that she does not believe she had " mini stroke" and is not interested in any care from this admission at this point. She is educated about benefits of aspirin in prevention of stroke again but she shows poor judgement about her diagnosis and prevention. She states any abnormality in her echocardiogram is not going to extend her admission and is interested in discharging to home.    Plan is to discharge her at this point and she will be informed about her Echocardiogram results via telephone. She will be provided with prescription of Aspirin and Atorvastatin.

## 2022-04-04 IMAGING — MR MR MRA HEAD W/O CM
7 series · 16 of 16 positions shown · IV contrast (Yes GAD)
Comparison: 10/24/2020 head CT.

CLINICAL DATA: Seizure, abnormal neuro exam; Neuro deficit, acute,
stroke suspected

EXAM:
MRI HEAD WITHOUT AND WITH CONTRAST
MRA HEAD WITHOUT CONTRAST
MRA NECK WITHOUT AND WITH CONTRAST
TECHNIQUE: Multiplanar, multiecho pulse sequences of the brain and surrounding
structures were obtained without and with intravenous contrast.
Angiographic images of the Circle of Willis were obtained using MRA
technique without intravenous contrast. Angiographic images of the
neck were obtained using MRA technique without and with intravenous
contrast. Carotid stenosis measurements (when applicable) are
obtained utilizing NASCET criteria, using the distal internal
carotid diameter as the denominator.
CONTRAST:  10mL GADAVIST GADOBUTROL 1 MMOL/ML IV SOLN

[Series 4: ax (id) · axial · 1.0mm · 0.43mm/px · z∈[-107,-18]mm · 4 of 184 slices shown]
[im 1/184]
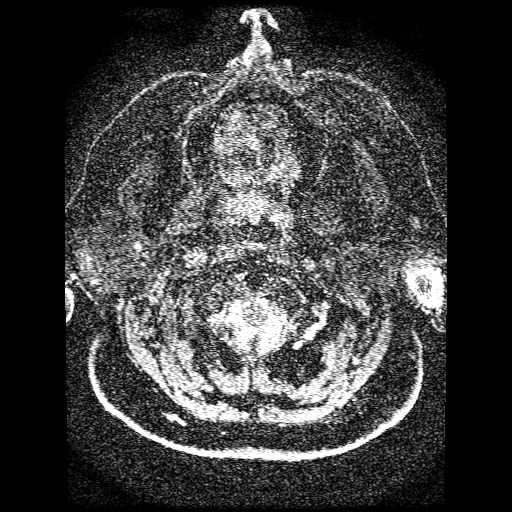
[im 62/184]
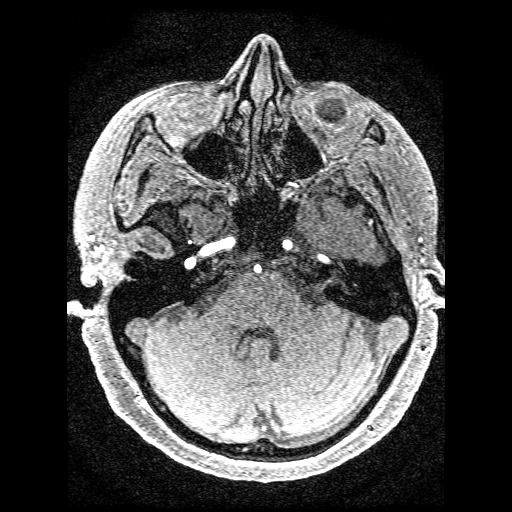
[im 123/184]
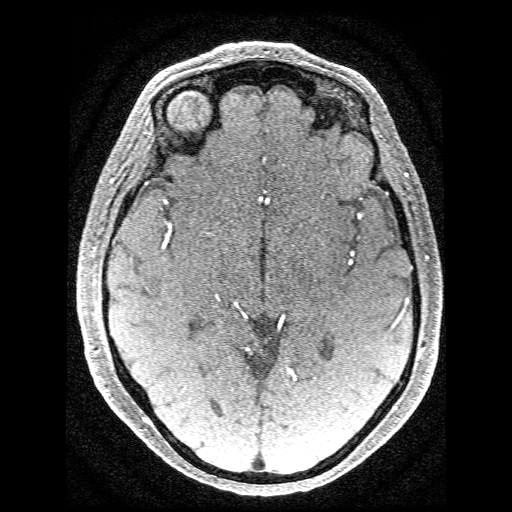
[im 184/184]
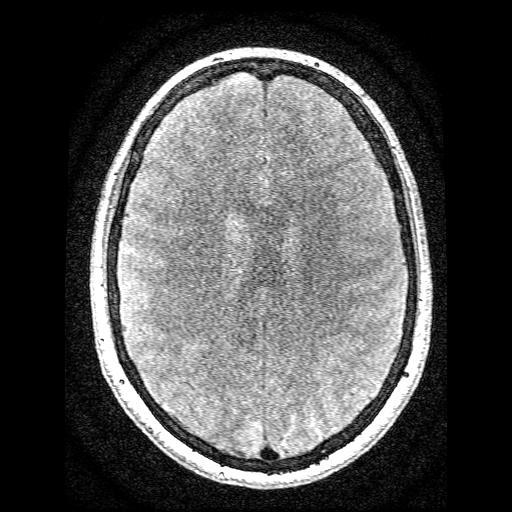

[Series 13: TOF · axial · 2.4mm · 0.47mm/px · z∈[-235,-106]mm · 2 of 112 slices shown]
[im 1/112]
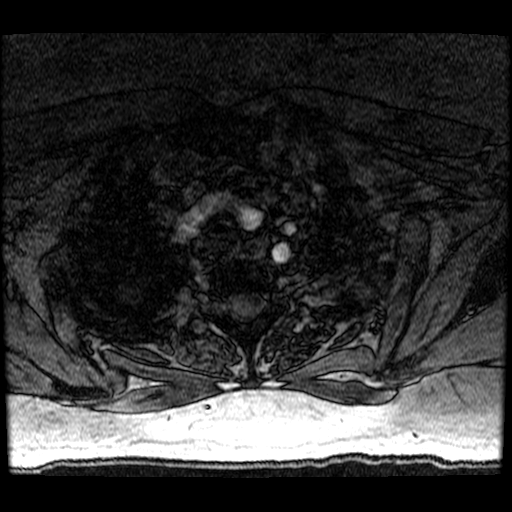
[im 112/112]
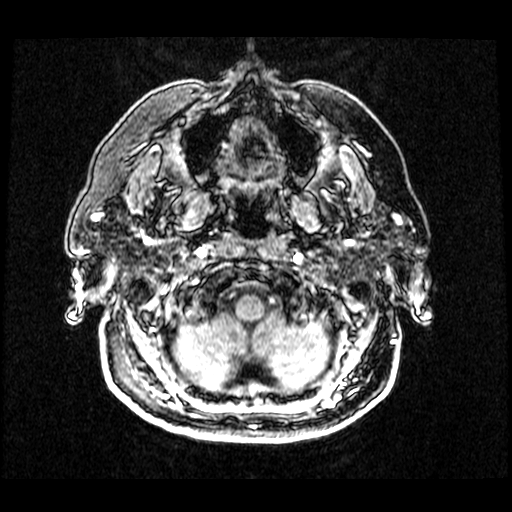

[Series 1500: cor cemra ft · coronal · 1.2mm · 0.59mm/px · 2 of 153 slices shown]
[im 1/153]
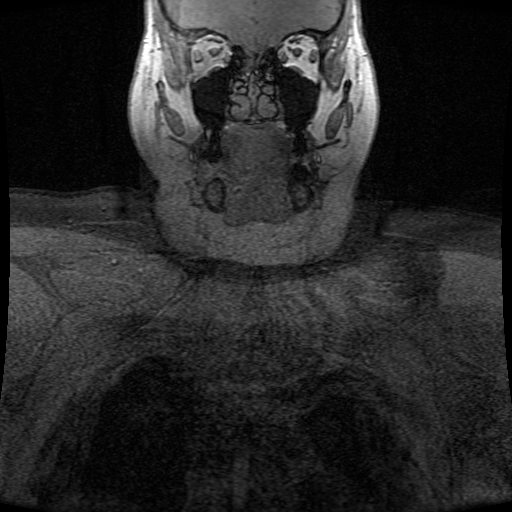
[im 153/153]
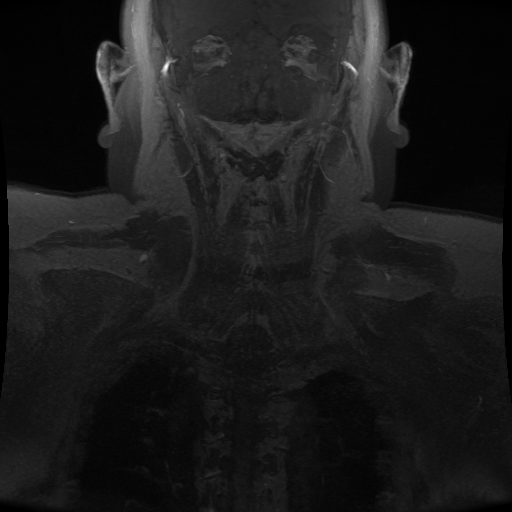

[Series 1501: ph1/cor cemra ft · coronal · 1.2mm · 0.59mm/px · 2 of 152 slices shown]
[im 1/152]
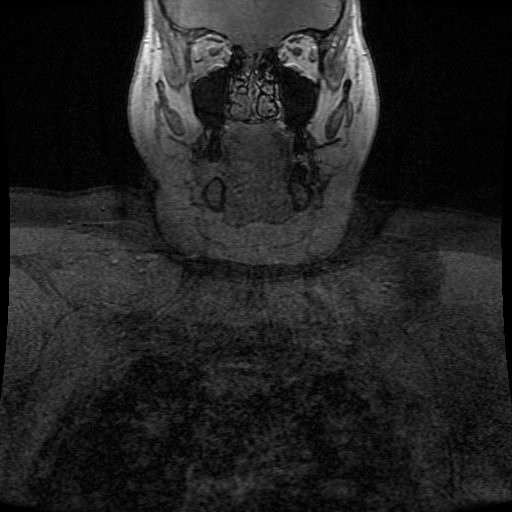
[im 152/152]
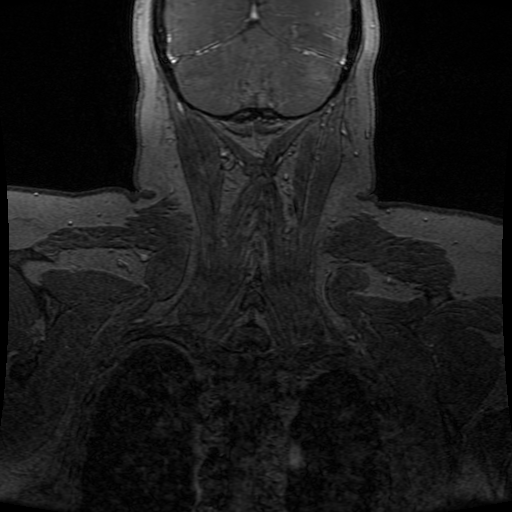

[Series 1502: ph2/cor cemra ft · coronal · 1.2mm · 0.59mm/px · 2 of 152 slices shown]
[im 1/152]
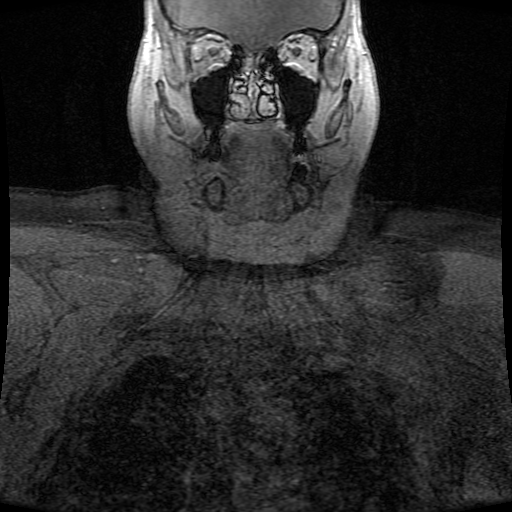
[im 152/152]
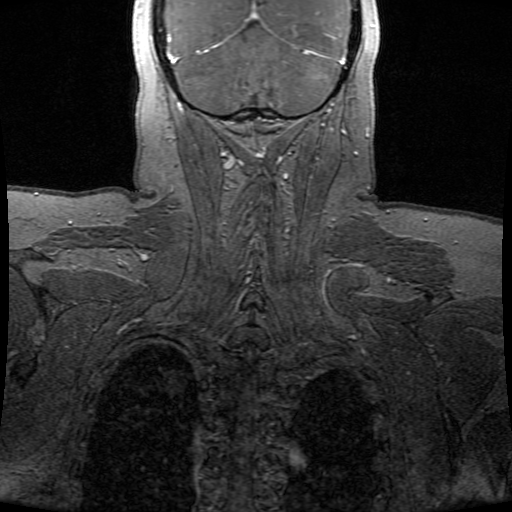

[((date))-((date)) · coronal · 1.2mm · 0.59mm/px · 2 of 153 slices shown (1 of 2)]
[im 1/153]
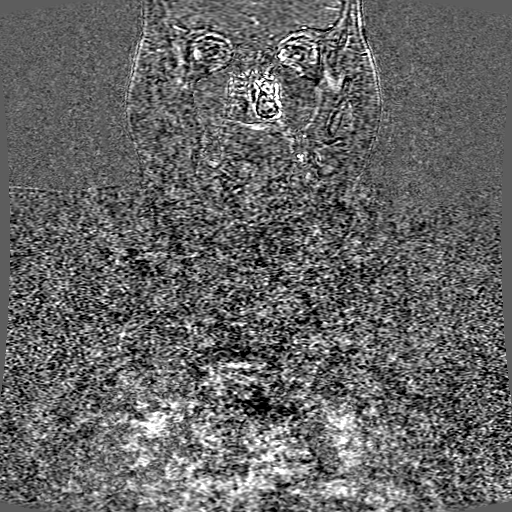
[im 153/153]
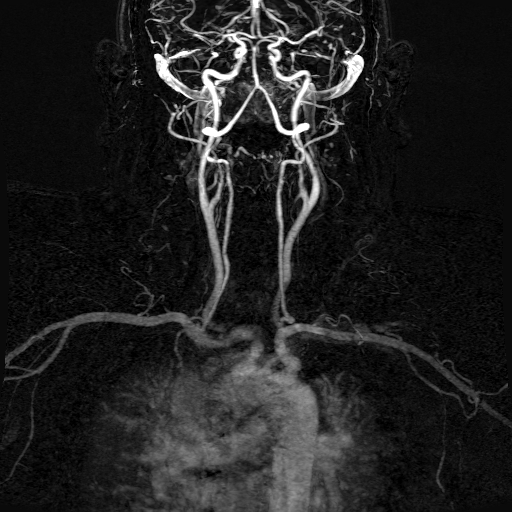

[((date))-((date)) · coronal · 1.2mm · 0.59mm/px · 2 of 153 slices shown (2 of 2)]
[im 1/153]
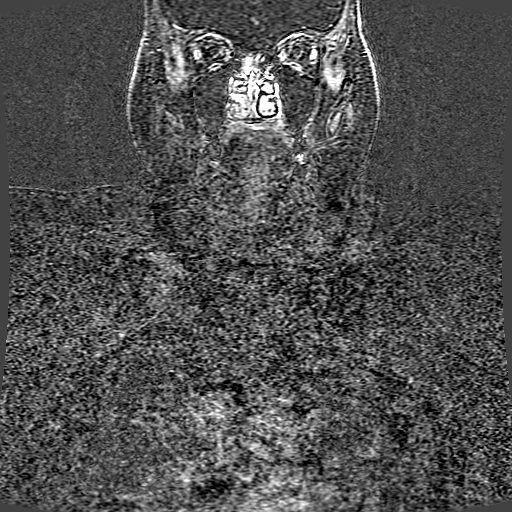
[im 153/153]
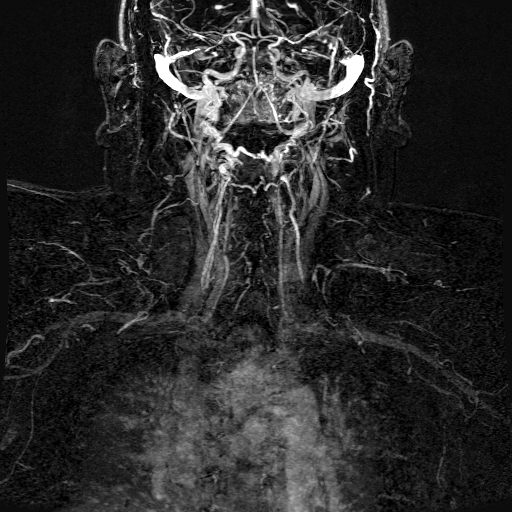

[16 of 16 positions shown; findings below may reference images not displayed]

FINDINGS: MRI HEAD FINDINGS

Brain: No diffusion-weighted signal abnormality. No intracranial
hemorrhage. No midline shift, ventriculomegaly or extra-axial fluid
collection. No mass lesion. No abnormal enhancement. Cerebral volume
is within normal limits. Minimal chronic microvascular ischemic
changes.

Vascular: Small right frontal developmental venous anomaly. Please
see MRA for additional details.

Skull and upper cervical spine: Normal marrow signal.

Sinuses/Orbits: Normal orbits. Left sphenoid sinus mucous retention
cyst. No mastoid effusion.

Other: None.

MRA HEAD FINDINGS

Anterior circulation: Patent ICAs, anterior and middle cerebral
arteries. No significant stenosis, proximal occlusion, aneurysm, or
vascular malformation. Bilateral PCOM hypoplasia.

Posterior circulation: Patent PICA. The V4 segments, basilar,
superior cerebellar and posterior cerebral arteries are patent. Mild
narrowing of the left superior cerebellar artery. No high-grade
stenosis, large vessel occlusion, aneurysm, or vascular
malformation.

Venous sinuses: No evidence of thrombosis.

Anatomic variants: Please see above.

MRA NECK FINDINGS

There is no high-grade narrowing or focal aneurysm involving the
bilateral carotid arteries. No evidence of dissection.

The bilateral vertebral arteries are patent and demonstrate
antegrade flow. Codominant vertebral arteries. No evidence of
high-grade narrowing or focal aneurysm.
IMPRESSION: MRI head:

No acute intracranial process. Minimal chronic microvascular
ischemic changes.

MRA head and neck:

No large vessel occlusion, high-grade narrowing, dissection or
aneurysm.

Mild left superior cerebellar artery narrowing.
